# Patient Record
Sex: Male | Born: 2000 | Race: White | Hispanic: No | Marital: Single
Health system: Southern US, Community
[De-identification: ages and names within clinical notes are randomized; demographics above are authoritative.]

## PROBLEM LIST (undated history)

## (undated) DIAGNOSIS — S060X9A Concussion with loss of consciousness of unspecified duration, initial encounter: Secondary | ICD-10-CM

## (undated) DIAGNOSIS — A4902 Methicillin resistant Staphylococcus aureus infection, unspecified site: Secondary | ICD-10-CM

## (undated) HISTORY — DX: Methicillin resistant Staphylococcus aureus infection, unspecified site: A49.02

## (undated) HISTORY — DX: Concussion with loss of consciousness of unspecified duration, initial encounter: S06.0X9A

## (undated) HISTORY — PX: ORCHIOPEXY: SHX479

---
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
## 2021-09-30 DIAGNOSIS — J069 Acute upper respiratory infection, unspecified: Secondary | ICD-10-CM | POA: Diagnosis not present

## 2021-10-14 DIAGNOSIS — Z20822 Contact with and (suspected) exposure to covid-19: Secondary | ICD-10-CM | POA: Diagnosis not present

## 2021-10-14 DIAGNOSIS — J00 Acute nasopharyngitis [common cold]: Secondary | ICD-10-CM | POA: Diagnosis not present

## 2024-06-30 ENCOUNTER — Other Ambulatory Visit: Payer: Self-pay

## 2024-06-30 ENCOUNTER — Emergency Department (HOSPITAL_BASED_OUTPATIENT_CLINIC_OR_DEPARTMENT_OTHER)
Admission: EM | Admit: 2024-06-30 | Discharge: 2024-07-01 | Disposition: A | Discharge status: Home / Self Care | Payer: Self-pay | Attending: Emergency Medicine | Admitting: Emergency Medicine

## 2024-06-30 ENCOUNTER — Encounter (HOSPITAL_BASED_OUTPATIENT_CLINIC_OR_DEPARTMENT_OTHER): Payer: Self-pay

## 2024-06-30 DIAGNOSIS — T7819XA Other adverse food reactions, not elsewhere classified, initial encounter: Secondary | ICD-10-CM | POA: Insufficient documentation

## 2024-06-30 DIAGNOSIS — R404 Transient alteration of awareness: Secondary | ICD-10-CM | POA: Insufficient documentation

## 2024-06-30 MED ORDER — IBUPROFEN 400 MG PO TABS
600.0000 mg | ORAL_TABLET | Freq: Once | ORAL | Status: AC
  Administered 2024-07-01: 600 mg via ORAL
  Filled 2024-06-30: qty 1

## 2024-06-30 MED ORDER — EPINEPHRINE 0.3 MG/0.3ML IJ SOAJ: 0.3000 mg | INTRAMUSCULAR | 0 refills | Status: AC | PRN
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
## 2024-06-30 NOTE — ED Triage Notes (Signed)
 Pt complaining of an allergic reaction to bannanas while eating at [employer]. Started about 1 and a half hours ago. He is tingling in his fingers, complaining of shortness of breath

## 2024-07-01 NOTE — ED Notes (Signed)
 Benjamin Wyatt called requesting we send a doctors note for him to go back to work to him my chart.  I gave info to charge nurse
